# Patient Record
Sex: Female | Born: 1965 | Race: White | Hispanic: No | Marital: Married | Smoking: Current every day smoker
Health system: Southern US, Community
[De-identification: ages and names within clinical notes are randomized; demographics above are authoritative.]

## PROBLEM LIST (undated history)

## (undated) HISTORY — PX: CHOLECYSTECTOMY: SHX55

## (undated) HISTORY — PX: APPENDECTOMY: SHX54

---

## 1998-02-10 ENCOUNTER — Ambulatory Visit (HOSPITAL_COMMUNITY): Admission: RE | Admit: 1998-02-10 | Discharge: 1998-02-10 | Payer: Self-pay | Admitting: Gastroenterology

## 1998-06-12 ENCOUNTER — Encounter: Payer: Self-pay | Admitting: Emergency Medicine

## 1998-06-12 ENCOUNTER — Emergency Department (HOSPITAL_COMMUNITY): Admission: EM | Admit: 1998-06-12 | Discharge: 1998-06-12 | Payer: Self-pay | Admitting: Emergency Medicine

## 1998-06-13 ENCOUNTER — Encounter: Payer: Self-pay | Admitting: Emergency Medicine

## 1998-11-23 ENCOUNTER — Emergency Department (HOSPITAL_COMMUNITY): Admission: EM | Admit: 1998-11-23 | Discharge: 1998-11-23 | Payer: Self-pay

## 1999-02-27 ENCOUNTER — Other Ambulatory Visit: Admission: RE | Admit: 1999-02-27 | Discharge: 1999-02-27 | Payer: Self-pay | Admitting: Obstetrics & Gynecology

## 1999-06-02 ENCOUNTER — Encounter: Payer: Self-pay | Admitting: Obstetrics & Gynecology

## 1999-06-02 ENCOUNTER — Ambulatory Visit (HOSPITAL_COMMUNITY): Admission: RE | Admit: 1999-06-02 | Discharge: 1999-06-02 | Payer: Self-pay | Admitting: Obstetrics & Gynecology

## 1999-08-26 ENCOUNTER — Ambulatory Visit (HOSPITAL_COMMUNITY): Admission: RE | Admit: 1999-08-26 | Discharge: 1999-08-26 | Payer: Self-pay | Admitting: Gastroenterology

## 1999-08-26 ENCOUNTER — Encounter (INDEPENDENT_AMBULATORY_CARE_PROVIDER_SITE_OTHER): Payer: Self-pay | Admitting: Specialist

## 2000-03-06 ENCOUNTER — Emergency Department (HOSPITAL_COMMUNITY): Admission: EM | Admit: 2000-03-06 | Discharge: 2000-03-06 | Payer: Self-pay | Admitting: Emergency Medicine

## 2000-04-04 ENCOUNTER — Other Ambulatory Visit: Admission: RE | Admit: 2000-04-04 | Discharge: 2000-04-04 | Payer: Self-pay | Admitting: Obstetrics & Gynecology

## 2000-07-21 ENCOUNTER — Encounter: Payer: Self-pay | Admitting: Emergency Medicine

## 2000-07-21 ENCOUNTER — Emergency Department (HOSPITAL_COMMUNITY): Admission: EM | Admit: 2000-07-21 | Discharge: 2000-07-21 | Payer: Self-pay | Admitting: Emergency Medicine

## 2000-09-05 ENCOUNTER — Encounter: Admission: RE | Admit: 2000-09-05 | Discharge: 2000-09-05 | Payer: Self-pay | Admitting: Family Medicine

## 2000-09-05 ENCOUNTER — Encounter: Payer: Self-pay | Admitting: Family Medicine

## 2001-04-18 ENCOUNTER — Other Ambulatory Visit: Admission: RE | Admit: 2001-04-18 | Discharge: 2001-04-18 | Payer: Self-pay | Admitting: Obstetrics & Gynecology

## 2001-12-08 ENCOUNTER — Encounter: Admission: RE | Admit: 2001-12-08 | Discharge: 2001-12-08 | Payer: Self-pay | Admitting: Family Medicine

## 2001-12-08 ENCOUNTER — Encounter: Payer: Self-pay | Admitting: Family Medicine

## 2002-03-09 ENCOUNTER — Encounter: Payer: Self-pay | Admitting: Family Medicine

## 2002-03-09 ENCOUNTER — Encounter: Admission: RE | Admit: 2002-03-09 | Discharge: 2002-03-09 | Payer: Self-pay | Admitting: Family Medicine

## 2002-06-18 ENCOUNTER — Encounter: Payer: Self-pay | Admitting: Family Medicine

## 2002-06-18 ENCOUNTER — Encounter: Admission: RE | Admit: 2002-06-18 | Discharge: 2002-06-18 | Payer: Self-pay | Admitting: Family Medicine

## 2002-12-20 ENCOUNTER — Other Ambulatory Visit: Admission: RE | Admit: 2002-12-20 | Discharge: 2002-12-20 | Payer: Self-pay | Admitting: Family Medicine

## 2003-03-18 ENCOUNTER — Encounter: Admission: RE | Admit: 2003-03-18 | Discharge: 2003-03-18 | Payer: Self-pay | Admitting: Orthopedic Surgery

## 2003-05-01 ENCOUNTER — Ambulatory Visit (HOSPITAL_COMMUNITY): Admission: RE | Admit: 2003-05-01 | Discharge: 2003-05-02 | Payer: Self-pay | Admitting: Orthopaedic Surgery

## 2003-05-29 ENCOUNTER — Encounter: Admission: RE | Admit: 2003-05-29 | Discharge: 2003-05-29 | Payer: Self-pay | Admitting: Obstetrics & Gynecology

## 2003-07-19 ENCOUNTER — Other Ambulatory Visit: Admission: RE | Admit: 2003-07-19 | Discharge: 2003-07-19 | Payer: Self-pay | Admitting: Obstetrics & Gynecology

## 2004-03-07 ENCOUNTER — Encounter: Admission: RE | Admit: 2004-03-07 | Discharge: 2004-03-07 | Payer: Self-pay | Admitting: Orthopedic Surgery

## 2004-05-29 ENCOUNTER — Emergency Department (HOSPITAL_COMMUNITY): Admission: EM | Admit: 2004-05-29 | Discharge: 2004-05-30 | Payer: Self-pay | Admitting: Emergency Medicine

## 2004-06-10 ENCOUNTER — Ambulatory Visit (HOSPITAL_COMMUNITY): Admission: RE | Admit: 2004-06-10 | Discharge: 2004-06-10 | Payer: Self-pay | Admitting: Gastroenterology

## 2004-06-10 ENCOUNTER — Encounter: Admission: RE | Admit: 2004-06-10 | Discharge: 2004-06-10 | Payer: Self-pay | Admitting: Gastroenterology

## 2004-06-12 ENCOUNTER — Ambulatory Visit (HOSPITAL_COMMUNITY): Admission: RE | Admit: 2004-06-12 | Discharge: 2004-06-12 | Payer: Self-pay | Admitting: Gastroenterology

## 2004-06-12 ENCOUNTER — Encounter (INDEPENDENT_AMBULATORY_CARE_PROVIDER_SITE_OTHER): Payer: Self-pay | Admitting: *Deleted

## 2004-06-24 ENCOUNTER — Encounter: Admission: RE | Admit: 2004-06-24 | Discharge: 2004-06-24 | Payer: Self-pay | Admitting: Obstetrics & Gynecology

## 2004-08-13 ENCOUNTER — Other Ambulatory Visit: Admission: RE | Admit: 2004-08-13 | Discharge: 2004-08-13 | Payer: Self-pay | Admitting: Obstetrics & Gynecology

## 2005-04-26 ENCOUNTER — Encounter: Admission: RE | Admit: 2005-04-26 | Discharge: 2005-04-26 | Payer: Self-pay | Admitting: Orthopedic Surgery

## 2005-10-14 ENCOUNTER — Encounter: Admission: RE | Admit: 2005-10-14 | Discharge: 2005-10-14 | Payer: Self-pay | Admitting: Obstetrics & Gynecology

## 2006-02-17 ENCOUNTER — Encounter: Admission: RE | Admit: 2006-02-17 | Discharge: 2006-02-17 | Payer: Self-pay | Admitting: Orthopedic Surgery

## 2006-02-19 ENCOUNTER — Encounter: Admission: RE | Admit: 2006-02-19 | Discharge: 2006-02-19 | Payer: Self-pay | Admitting: Orthopaedic Surgery

## 2006-03-30 ENCOUNTER — Encounter: Admission: RE | Admit: 2006-03-30 | Discharge: 2006-03-30 | Payer: Self-pay | Admitting: Orthopaedic Surgery

## 2007-01-02 ENCOUNTER — Emergency Department (HOSPITAL_COMMUNITY): Admission: EM | Admit: 2007-01-02 | Discharge: 2007-01-02 | Payer: Self-pay | Admitting: Emergency Medicine

## 2007-03-08 ENCOUNTER — Ambulatory Visit (HOSPITAL_COMMUNITY): Admission: RE | Admit: 2007-03-08 | Discharge: 2007-03-08 | Payer: Self-pay | Admitting: Obstetrics & Gynecology

## 2007-08-17 ENCOUNTER — Emergency Department (HOSPITAL_COMMUNITY): Admission: EM | Admit: 2007-08-17 | Discharge: 2007-08-17 | Payer: Self-pay | Admitting: Emergency Medicine

## 2008-02-02 ENCOUNTER — Ambulatory Visit (HOSPITAL_COMMUNITY): Admission: RE | Admit: 2008-02-02 | Discharge: 2008-02-02 | Payer: Self-pay | Admitting: Obstetrics & Gynecology

## 2008-02-05 ENCOUNTER — Encounter (INDEPENDENT_AMBULATORY_CARE_PROVIDER_SITE_OTHER): Payer: Self-pay | Admitting: Diagnostic Radiology

## 2008-02-05 ENCOUNTER — Encounter: Admission: RE | Admit: 2008-02-05 | Discharge: 2008-02-05 | Payer: Self-pay | Admitting: Obstetrics & Gynecology

## 2009-01-28 ENCOUNTER — Encounter: Admission: RE | Admit: 2009-01-28 | Discharge: 2009-01-28 | Payer: Self-pay | Admitting: Gastroenterology

## 2009-06-07 ENCOUNTER — Emergency Department (HOSPITAL_COMMUNITY): Admission: EM | Admit: 2009-06-07 | Discharge: 2009-06-07 | Payer: Self-pay | Admitting: Emergency Medicine

## 2009-07-05 ENCOUNTER — Emergency Department (HOSPITAL_COMMUNITY): Admission: EM | Admit: 2009-07-05 | Discharge: 2009-07-05 | Payer: Self-pay | Admitting: Emergency Medicine

## 2009-12-04 ENCOUNTER — Emergency Department (HOSPITAL_COMMUNITY): Admission: EM | Admit: 2009-12-04 | Discharge: 2009-12-04 | Payer: Self-pay | Admitting: Emergency Medicine

## 2010-07-25 LAB — CBC
Platelets: 212 10*3/uL (ref 150–400)
RBC: 3.91 MIL/uL (ref 3.87–5.11)
WBC: 5.4 10*3/uL (ref 4.0–10.5)

## 2010-07-25 LAB — COMPREHENSIVE METABOLIC PANEL
AST: 21 U/L (ref 0–37)
Albumin: 3.7 g/dL (ref 3.5–5.2)
Alkaline Phosphatase: 30 U/L — ABNORMAL LOW (ref 39–117)
Chloride: 107 mEq/L (ref 96–112)
GFR calc Af Amer: >60 mL/min (ref >60)
Potassium: 4.2 mEq/L (ref 3.5–5.1)
Total Bilirubin: 0.7 mg/dL (ref 0.3–1.2)

## 2010-07-29 LAB — URINALYSIS, ROUTINE W REFLEX MICROSCOPIC
Glucose, UA: NEGATIVE mg/dL
Hgb urine dipstick: NEGATIVE
Specific Gravity, Urine: 1.007 (ref 1.005–1.030)
Urobilinogen, UA: 0.2 mg/dL (ref 0.0–1.0)

## 2010-07-29 LAB — CBC
HCT: 40.3 % (ref 36.0–46.0)
MCV: 96.9 fL (ref 78.0–100.0)
RBC: 4.16 MIL/uL (ref 3.87–5.11)
WBC: 7.7 10*3/uL (ref 4.0–10.5)

## 2010-07-29 LAB — POCT I-STAT, CHEM 8
BUN: 18 mg/dL (ref 6–23)
Chloride: 107 mEq/L (ref 96–112)
Creatinine, Ser: 1 mg/dL (ref 0.4–1.2)
Sodium: 138 mEq/L (ref 135–145)
TCO2: 28 mmol/L (ref 0–100)

## 2010-07-29 LAB — DIFFERENTIAL
Eosinophils Absolute: 0.1 10*3/uL (ref 0.0–0.7)
Lymphocytes Relative: 19 % (ref 12–46)
Lymphs Abs: 1.4 10*3/uL (ref 0.7–4.0)
Monocytes Relative: 6 % (ref 3–12)
Neutrophils Relative %: 75 % (ref 43–77)

## 2010-07-29 LAB — POCT CARDIAC MARKERS
CKMB, poc: <1 ng/mL — ABNORMAL LOW (ref 1.0–8.0)
Myoglobin, poc: 35.7 ng/mL (ref 12–200)
Myoglobin, poc: 45.5 ng/mL (ref 12–200)
Troponin i, poc: <0.05 ng/mL (ref 0.00–0.09)

## 2010-09-25 NOTE — Procedures (Signed)
[place]. [facility]  Patient:    Nichole Ellis, Nichole Ellis                   MRN: [id number] Proc. Date: 08/26/99 Adm. Date:  [id number] Disc. Date: [id number] Attending:  Charna Elizabeth Dictator:   Anselmo Rod, M.D. CC:         Freddy Finner, M.D.                           Procedure Report  DATE OF BIRTH:  December 30, 1965  PROCEDURE PERFORMED:  Esophagogastroduodenoscopy with biopsies.  ENDOSCOPIST:  Anselmo Rod, M.D.  INSTRUMENT USED:  Olympus video pan endoscope.  INDICATIONS FOR PROCEDURE:  Severe epigastric pain not responding to PPIs in a 45 year old white female, rule out peptic ulcer disease, esophagitis, gastritis, etc.  PRE-PROCEDURE PREPARATION:  Informed consent was obtained from the patient. The patient was fasted for eight hours prior to the procedure.  PRE-PROCEDURE PHYSICAL EXAMINATION:  VITAL SIGNS:  The patient had stable vital signs.  NECK:  Supple.  CHEST:  Clear to auscultation.  S1 and S2 regular.  ABDOMEN:  Soft, with normal abdominal bowel sounds.  Epigastric tenderness on palpation with guarding, no rebound or rigidity.  DESCRIPTION OF PROCEDURE:  The patient was placed in the left lateral decubitus position and sedated with 40 mg of Demerol and 4 mg of Versed intravenously.  Once the patient was adequately sedated and maintained on low flow oxygen and continuous cardiac monitoring, the Olympus video pan endoscope was advanced through the mouth piece over the tongue into the esophagus under direct vision.  The entire esophagus appeared normal without evidence of ring, stricture, mass, lesions, or esophagitis.  The scope was then advanced into the stomach.  There was a small hiatal hernia seen on high retroflexion. There was active reflux of the gastric debris into the distal esophagus during the procedure.  There was antral gastritis with erythematous mucosa over the antrum.  Biopsies were done from the antrum to rule  out the presence of Helicobacter pylori by CLOtest.  The duodenal bulb and the small bowel distal to above appeared normal.  There was no outlet obstruction.  The patient tolerated the procedure well without complication.  IMPRESSION: 1. Normal appearing esophagus. 2. Severe antral gastritis with patchy hemorrhagic areas. 3. Small hiatal hernia. 4. ______ of small bowel. 5. Active reflux of gastric debris into the distal esophagus.  RECOMMENDATIONS: 1. Continue PPIs. 2. Treat with antibiotics if CLO positive. 3. Avoid all nonsteroidals. 4. Follow antireflux measures strictly. 5. Follow up in the office in the next ten days. DD:  08/28/99 TD:  08/29/99 Job: [id number]

## 2010-09-25 NOTE — H&P (Signed)
NAME:  Nichole Ellis, Nichole Ellis                      ACCOUNT NO.:  [id number]   MEDICAL RECORD NO.:  [id number]                   PATIENT TYPE:  OIB   LOCATION:  5030                                 FACILITY:  [facility]   PHYSICIAN:  Sharolyn Douglas, M.D.                     DATE OF BIRTH:  22-Sep-1965   DATE OF ADMISSION:  05/01/2003  DATE OF DISCHARGE:                                HISTORY & PHYSICAL   CHIEF COMPLAINT:  Neck and right upper extremity pain.   HISTORY OF PRESENT ILLNESS:  The patient is a 45 year old female who has  been having progressing neck and right upper extremity pain since March.  She denies any traumatic event at that time.  Has gotten a little bit better  shortly after beginning; however, since that time shortly after the injury  has been progressively getting worse.  She describes this as severe pain in  her neck, a burning, aching sensation radiating into her right trapezius and  going down her right upper extremity.  She has tried numerous subsequent  conservative management which, unfortunately, have not given her any  reliable relief of her symptoms.  Secondary to these findings as well as her  MRI findings of HNP at C5-6 and ____________ ACDF at this level.  Risks and  benefits of procedure were discussed with patient by Dr. Noel Gerold as well as  myself.  She indicated understanding and opted to proceed.   ALLERGIES:  No known drug allergies.   MEDICATIONS:  1. Mobic daily.  2. Robaxin p.r.n.   PAST MEDICAL HISTORY:  Healthy.   PAST SURGICAL HISTORY:  1. Partial hysterectomy in 1991.  2. Cholecystectomy in 1996.  3. Appendectomy at age 3.   SOCIAL HISTORY:  The patient smokes approximately half to one pack of  cigarettes per day.  Drinks alcohol on an occasional basis.  She is  divorced, has three children, one 2 year old living with her.   FAMILY HISTORY:  Father alive at age 38 with pleurisy, hypertension.  Mother  alive at 55 and healthy.  Sister with  history of breast cancer.   REVIEW OF SYSTEMS:  The patient denies fevers, chills, sweats, bleeding  tendencies.  CNS:  Denies blurred vision, double vision, seizure, headache,  paralysis.  CARDIOVASCULAR:  Denies chest pain, angina, orthopnea,  claudication, palpitations.  PULMONARY:  Denies shortness of breath,  productive cough, or hemoptysis.  GASTROINTESTINAL:  Denies nausea,  vomiting, constipation, diarrhea, melena, or bloody stool.  GENITOURINARY:  Denies dysuria, hematuria, discharge.  MUSCULOSKELETAL:  As per HPI.   PHYSICAL EXAMINATION:  GENERAL:  The patient is a 45 year old white female  who is alert and oriented, in no acute distress.  She is well nourished,  well groomed, appears her stated age, pleasant, cooperative to examination.  HEENT:  Head is normocephalic, atraumatic.  Pupils are equal, round, and  reactive to light.  Extraocular movements are intact.  Nares patent.  Pharynx clear.  NECK:  Soft to palpation.  No lymphadenopathy, thyromegaly, or bruits  appreciated.  CHEST:  Clear to auscultation bilaterally.  No rales, rhonchi, strider,  wheezes, friction rubs.  BREAST:  Not pertinent.  Not performed.  HEART:  S1, S2.  Regular rate and rhythm.  No murmurs, rubs, or gallops.  ABDOMEN:  Soft to palpation.  Positive bowel sounds.  Nontender,  nondistended.  No organomegaly noted.  GENITOURINARY:  Not pertinent.  Not performed.  EXTREMITIES:  The patient has right upper extremity pain.  Pulses are intact  and symmetric.  Motor function and sensation are grossly intact.  SKIN:  Intact without lesions or rashes.   LABORATORIES:  MRI shows HNP at C5-6 on the right.   IMPRESSION:  Herniated nucleus pulposus C5-6.   PLAN:  1. Admit to [facility] on May 01, 2003, for an ACDF of C5-6.     This will be done by Sharolyn Douglas, M.D.  2. The patient's primary care physician is Dr. Jennette Kettle in [place].      Verlin Fester, P.A.                       Sharolyn Douglas,  M.D.    CM/MEDQ  D:  05/01/2003  T:  05/02/2003  Job:  [id number]

## 2010-09-25 NOTE — Op Note (Signed)
NAMEHANNAN, Nichole Ellis            ACCOUNT NO.:  [id number]   MEDICAL RECORD NO.:  [id number]          PATIENT TYPE:  AMB   LOCATION:  ENDO                         FACILITY:  [facility]   PHYSICIAN:  Anselmo Rod, M.D.  DATE OF BIRTH:  01/12/1966   DATE OF PROCEDURE:  06/12/2004  DATE OF DISCHARGE:                                 OPERATIVE REPORT   PROCEDURE PERFORMED:  Esophagogastroduodenoscopy with gastric biopsies.   ENDOSCOPIST:  Anselmo Rod, M.D.   INSTRUMENT USED:  Olympus video panendoscope.   INDICATIONS FOR PROCEDURE:  A 45 year old white female with history of  severe epigastric and right upper quadrant pain and some chest pain on the  right side with burning retrosternal discomfort, undergoing an EGD to rule  out peptic ulcer disease, esophagitis, gastritis, etc.   PREPROCEDURE PREPARATION:  Informed consent was procured from the patient.  The patient fasted for eight hours prior to the procedure and prepped with a  bottle of magnesium citrate and a gallon of GoLYTELY the night prior to the  procedure.   PREPROCEDURE PHYSICAL EXAMINATION:  VITAL SIGNS:  Stable.  NECK:  Supple.  CHEST:  Clear to auscultation.  CARDIOVASCULAR:  S1 and S2 regular.  ABDOMEN:  Soft with normal bowel sounds.   DESCRIPTION OF PROCEDURE:  The patient was placed in left lateral decubitus  position, sedated with 60 mg of Demerol and 8  mg of Versed in slow  incremental doses.  Once the patient was adequately sedated and maintained  on low flow oxygen and continuous cardiac monitoring, the Olympus video  panendoscope was advanced through the mouthpiece over the tongue and into  the esophagus under direct vision.  The entire esophagus appeared normal  with no evidence of  ring, structure, masses, esophagus or Barrett's mucosa.  The scope was then advanced in the stomach.  A small hiatal hernia was seen  on high retroflexion.  The gastric mucosa appeared healthy.  No ulcers,  erosions,  masses or polyps were seen. Gastric biopsies were done to rule out  the present of H. pylori by pathology.  The proximal small-bowel appeared  normal.  There was no outlet obstruction. The patient tolerated the  procedure well without complications.   IMPRESSION:  1.  Normal-appearing  esophagus, no esophagitis, Barrett's or stricture      seen.  2.  A small hiatal hernia, otherwise normal-appearing stomach, gastric      biopsies done for Helicobacter pylori.  3.  Normal proximal small-bowel.   RECOMMENDATIONS:  1.  Continue PPIs.  2.  Add Carafate slurry 1 g q.i.d. for the next two weeks.  3.  Avoid all nonsteroidals.  4.  Stop smoking.  5.  Outpatient follow-up within the next two weeks for further      recommendations.      JNM/MEDQ  D:  06/12/2004  T:  06/12/2004  Job:  [id number]   cc:   Quita Skye. Artis Flock, M.D.  [address], [address]  [place]  [place] [address]  Fax: [phone]

## 2010-09-25 NOTE — Op Note (Signed)
NAME:  Nichole Ellis, Nichole Ellis                      ACCOUNT NO.:  [id number]   MEDICAL RECORD NO.:  [id number]                   PATIENT TYPE:  OIB   LOCATION:  5030                                 FACILITY:  [facility]   PHYSICIAN:  Sharolyn Douglas, M.D.                     DATE OF BIRTH:  [birth year]   DATE OF PROCEDURE:  05/01/2003  DATE OF DISCHARGE:                                 OPERATIVE REPORT   PREOPERATIVE DIAGNOSIS:  Cervical radiculopathy and herniated nucleus  pulposus, C5-6.   POSTOPERATIVE DIAGNOSIS:  Cervical radiculopathy and herniated nucleus  pulposus, C5-6.   OPERATION PERFORMED:  1. Anterior cervical diskectomy,  C5-6.  2. Anterior cervical arthrodesis, C5-6 with placement of Nuvasive allograft     prosthesis spacer packed with local autogenous bone graft with anterior     cervical plating C5-6 utilizing the Trinica system.   SURGEON:  Sharolyn Douglas, M.D.   ASSISTANT:  Verlin Fester, P.A.   ANESTHESIA:  General endotracheal.   COMPLICATIONS:  None.   INDICATIONS FOR PROCEDURE:  The patient is a 45 year old right-handed  dominant female with a four-month history of intractable right upper  extremity pain.  Her MRI scan demonstrated a moderately large disk  herniation at C5-6 which was felt to be symptomatic.  She had failed all  conservative treatment modalities and elected to undergo ACDF at C5-6 in  hopes of improving her symptoms.  Risks, benefits and alternatives discussed  and the patient elected to proceed.   DESCRIPTION OF PROCEDURE:  The patient was properly identified in the  holding area and taken to the operating room.  She was given general  endotracheal anesthesia without difficulty.  She was given prophylactic  intravenous antibiotics.  She was carefully positioned on the operating  table with the neck in slight extension using the Mayfield head rest.  All  bony prominences were padded.  Face and eyes protected at all times.  The  neck was prepped and draped  in the usual sterile fashion.  A 3 cm incision  was made on the left side of the neck just above the cricoid cartilage in a  natural skin crease.  Dissection was carried sharply through the platysma.  The interval between sternocleidomastoid and strap muscles medially was  developed down to the prevertebral space.  The esophagus, trachea and  carotid sheath were identified and protected at all times.  The longus colli  muscle was elevated out over the C5-6 disk space after the spinal needle and  x-ray confirmed its location.  A deep Shadow Line retractor was placed.  Sharp annulotomy was performed.  Diskectomy was carried back to the  posterior longitudinal ligament.  The disk material was found to be  moderately degenerative.  A high speed bur was used to remove the  cartilaginous end plates and take down the uncovertebral joint.  The  Kerrison punch was used to undercut the  posterior vertebral margins.  The  posterior longitudinal ligament was examined and a rent was identified on  the right side with disk material protruding through.  The rent was entered  using the Kerrison and the posterior longitudinal ligament was taken down.  We then removed a moderately large disk fragment from the spinal canal.  A  nerve hook was utilized to remove several additional foraminal and  posterolateral disk fragments from the spinal canal.  The wound was  irrigated.  We completed foraminotomies bilaterally.  The foramina were  patent using a blunt probe.  Bleeding controlled with bipolar electrocautery  and Gelfoam.  Caspar distraction pins were placed.  Gentle distraction  applied across the C5-6 level.  A 7 mm Nuvasive allograft prosthesis.  Nuvasive was packed with local autogenous bone graft collected from the bur  shavings.  The prosthesis graft was then carefully tamped into position and  countersunk 1 mm.  We confirmed there was room posterior.  We placed a  Trinica plate using standard  technique.  Four screws 12 mm in length were  utilized.  Her bone quality was soft.  The screw purchase was marginal.  We  ensured that the locking mechanism engaged.  The wound was irrigated.  Deep  Penrose drain left in place.  The platysma muscle closed with an interrupted  2-0 Vicryl, subcutaneous layer closed with interrupted 3-0 Vicryl followed  by a running 4-0 subcuticular suture on the skin.  Benzoin and Steri-Strips  placed.  Sterile dressing applied.  The patient was placed into a soft  cervical collar, extubated without difficulty and transferred to recovery  room in stable condition able to move her upper and lower extremities.   The first disk space identified over the  The wound was copiously irrigated.  Intraoperative x-ray showed good positioning of the plate and interbody  graft.  Deep Penrose drain was placed.  Platysma was closed with an  interrupted 2-0 Vicryl.  Subcutaneous layer closed with interrupted 3-0  Vicryl followed by a running 4-0 subcuticular suture on the skin.  Benzoin  and Steri-Strips were placed.  Sterile dressing applied.  The patient was  placed into a soft collar, extubated without difficulty and transferred to  the recovery room in stable condition.  It should be noted that there were  no deleterious changes in the SSEP's or EMG's throughout the procedure.                                               Sharolyn Douglas, M.D.    MC/MEDQ  D:  05/01/2003  T:  05/02/2003  Job:  [id number]

## 2011-02-02 LAB — CBC
HCT: 38.6
MCHC: 34.3
MCV: 94.3
Platelets: 223
RDW: 13.3

## 2011-02-02 LAB — POCT I-STAT, CHEM 8
Calcium, Ion: 1.15
Hemoglobin: 13.3
Sodium: 142
TCO2: 28

## 2011-02-02 LAB — DIFFERENTIAL
Basophils Relative: 1
Eosinophils Absolute: 0.2
Eosinophils Relative: 3
Neutrophils Relative %: 40 — ABNORMAL LOW

## 2011-02-02 LAB — D-DIMER, QUANTITATIVE: D-Dimer, Quant: 0.27

## 2011-02-02 LAB — POCT CARDIAC MARKERS
CKMB, poc: <1 — ABNORMAL LOW
Myoglobin, poc: 34.7
Myoglobin, poc: 35.3
Operator id: 146091
Operator id: 285841
Troponin i, poc: <0.05

## 2011-02-19 LAB — URINALYSIS, ROUTINE W REFLEX MICROSCOPIC
Bilirubin Urine: NEGATIVE
Glucose, UA: NEGATIVE
Hgb urine dipstick: NEGATIVE
Ketones, ur: NEGATIVE
Nitrite: NEGATIVE
Protein, ur: NEGATIVE
Specific Gravity, Urine: 1.014
Urobilinogen, UA: 0.2
pH: 5.5

## 2011-02-19 LAB — POCT PREGNANCY, URINE
Operator id: 146091
Preg Test, Ur: NEGATIVE

## 2011-02-19 LAB — I-STAT 8, (EC8 V) (CONVERTED LAB)
Acid-base deficit: 1
BUN: 10
Chloride: 106
HCT: 43
Operator id: 151321
Potassium: 3.5
pCO2, Ven: 46.5
pH, Ven: 7.337 — ABNORMAL HIGH

## 2011-02-19 LAB — URINE MICROSCOPIC-ADD ON

## 2011-02-19 LAB — POCT I-STAT CREATININE: Creatinine, Ser: 0.9

## 2011-07-26 IMAGING — CT CT ABDOMEN W/ CM
3 of 5 series · 13 of 36 positions shown, 19 images · IV contrast (READICAT/WATER & [ID] OMNI 300)
Comparison: Noncontrast abdominal pelvic CT 01/02/2007.

CT ABDOMEN

CLINICAL DATA: Right upper quadrant abdominal pain with nausea for
several months.  History of benign colonic polypectomy,
appendectomy and partial hysterectomy.

CT ABDOMEN AND PELVIS WITH CONTRAST
TECHNIQUE: Multidetector CT imaging of the abdomen and pelvis was
performed using the standard protocol following bolus
administration of intravenous contrast.
Contrast: 100 ml Mmnipaque-LII intravenously.  Oral contrast was
given.

[Series 3: routine abdomen · axial · 0.66mm/px · z∈[-362,-78]mm · 7 of 76 slices shown, 12 images]
[im 10/76  soft-tissue]
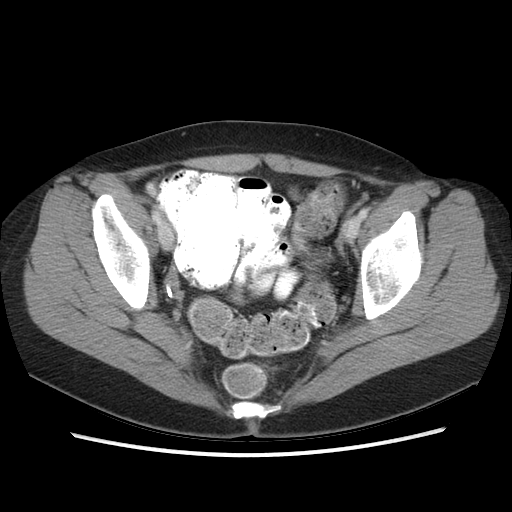
[im 10/76  bone]
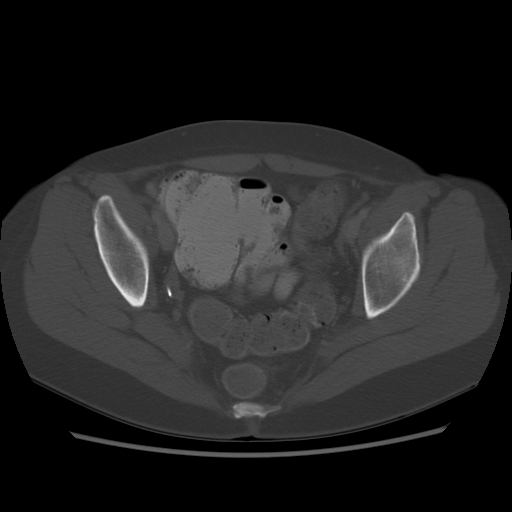
[im 19/76  soft-tissue]
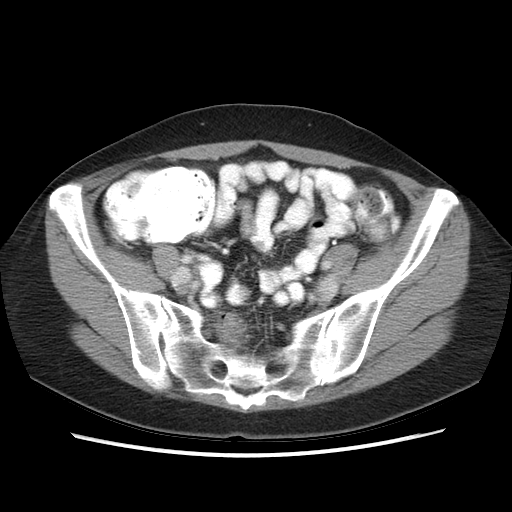
[im 29/76  soft-tissue]
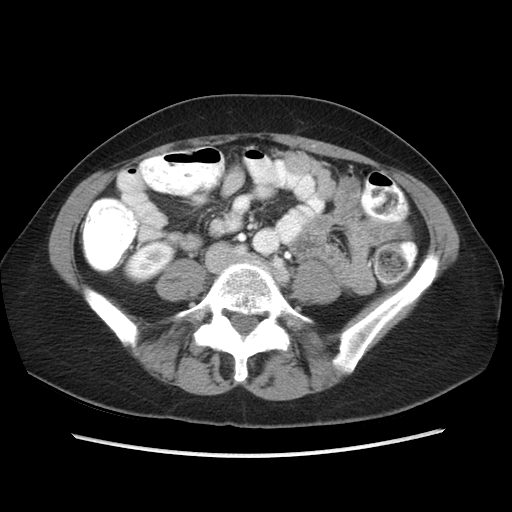
[im 38/76  soft-tissue]
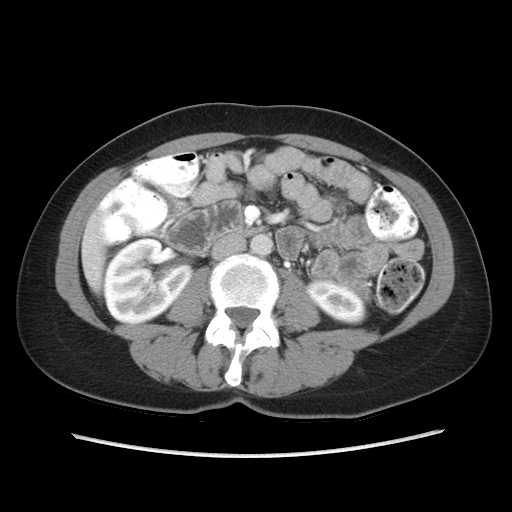
[im 38/76  lung]
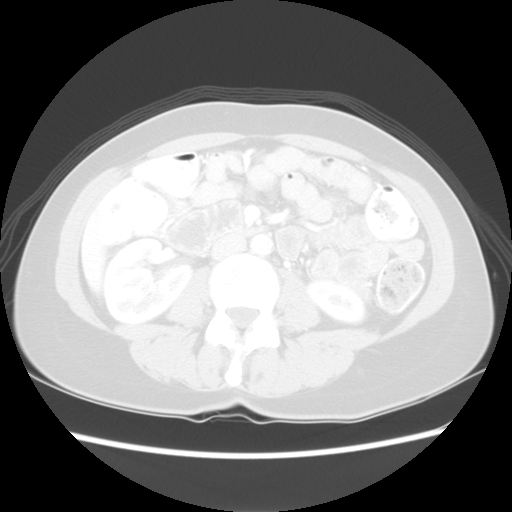
[im 47/76  soft-tissue]
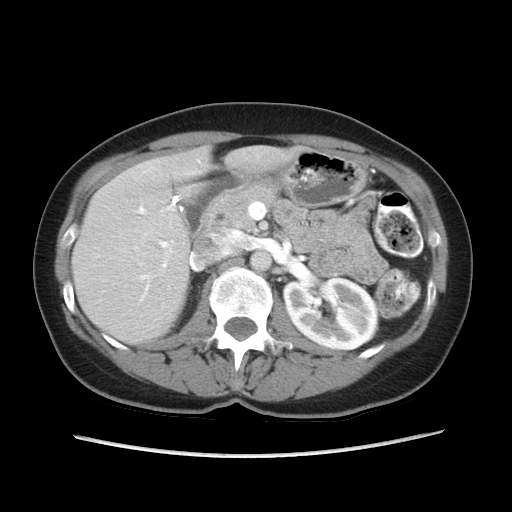
[im 47/76  lung]
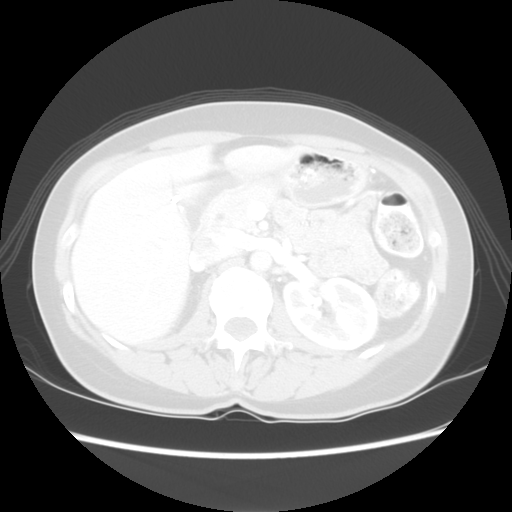
[im 57/76  soft-tissue]
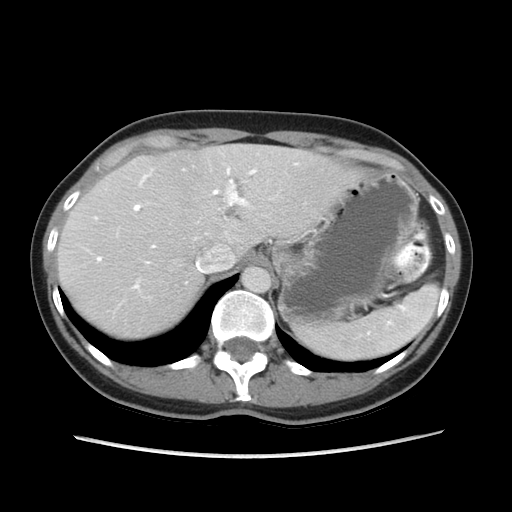
[im 57/76  lung]
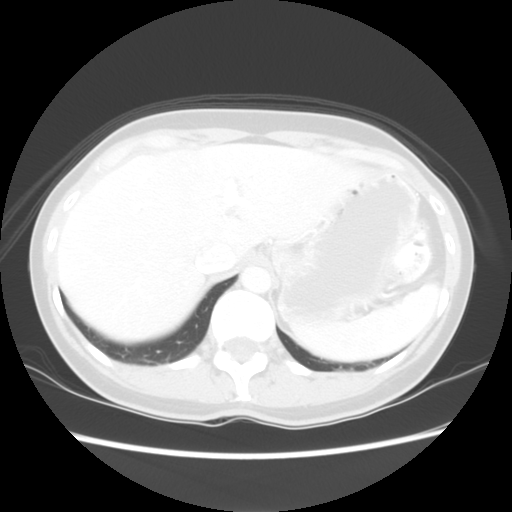
[im 66/76  soft-tissue]
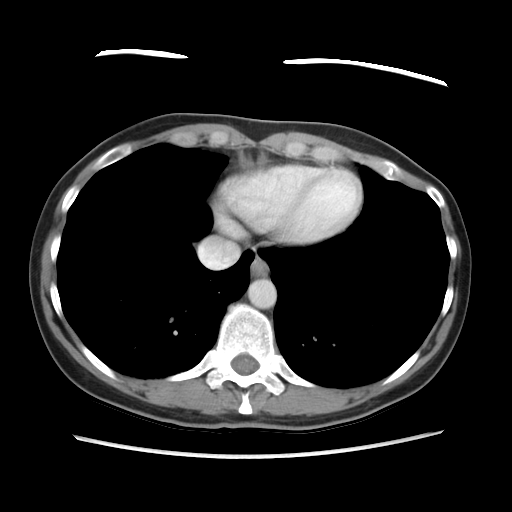
[im 66/76  lung]
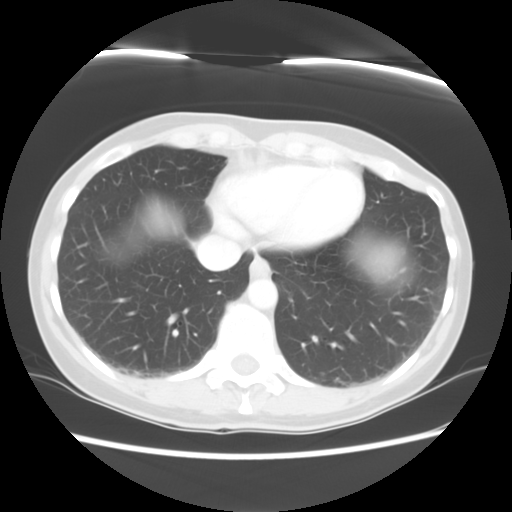

[Series 601: coronal body · coronal · 0.89mm/px · 1 of 102 slices shown, 2 images]
[im 34/102  soft-tissue]
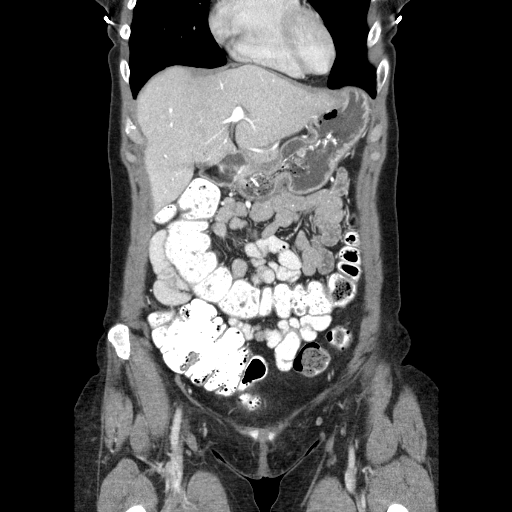
[im 34/102  bone]
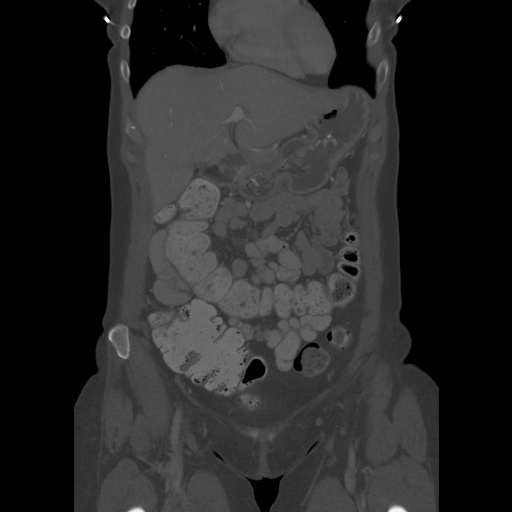

[Series 602: sagittal body · sagittal · 0.89mm/px · 5 of 137 slices shown]
[im 9/137  soft-tissue]
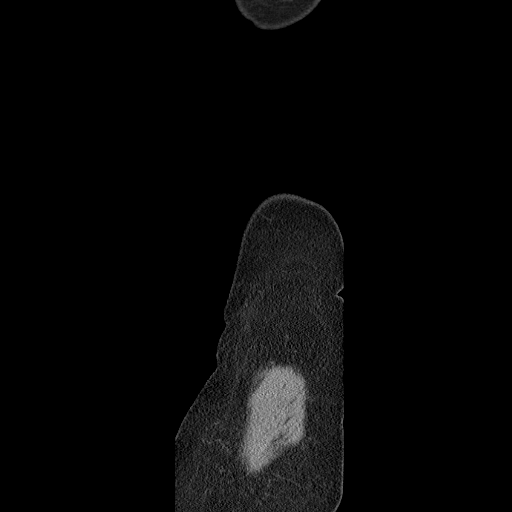
[im 26/137  soft-tissue]
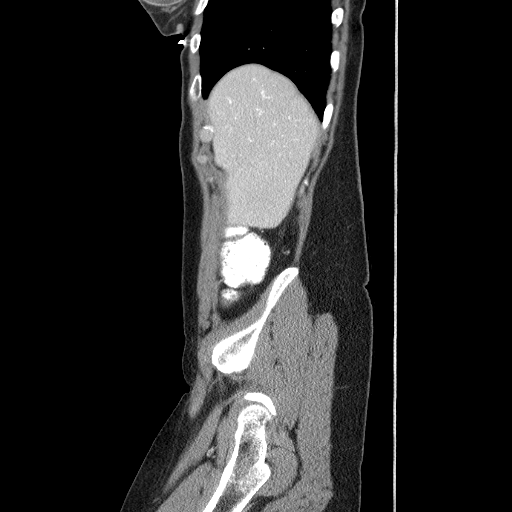
[im 43/137  soft-tissue]
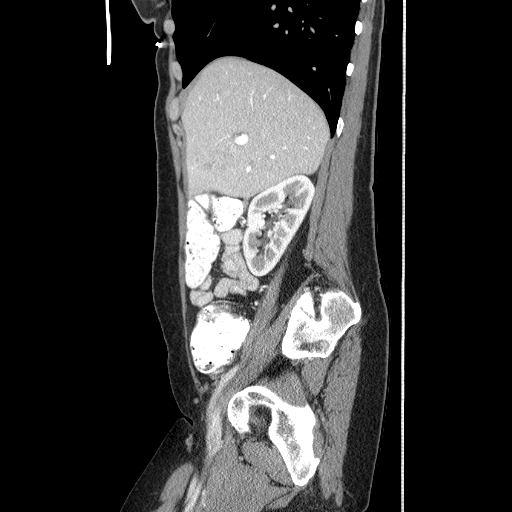
[im 60/137  soft-tissue]
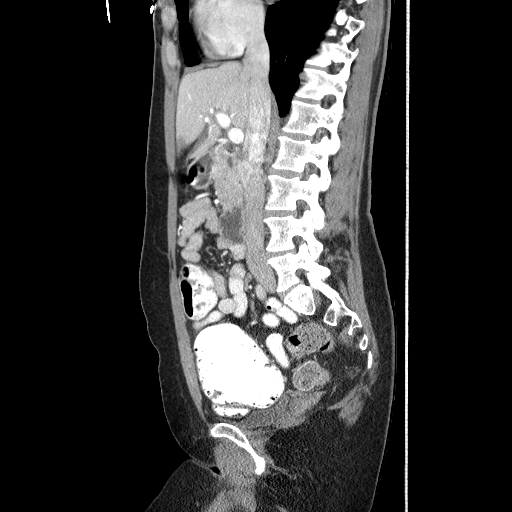
[im 77/137  soft-tissue]
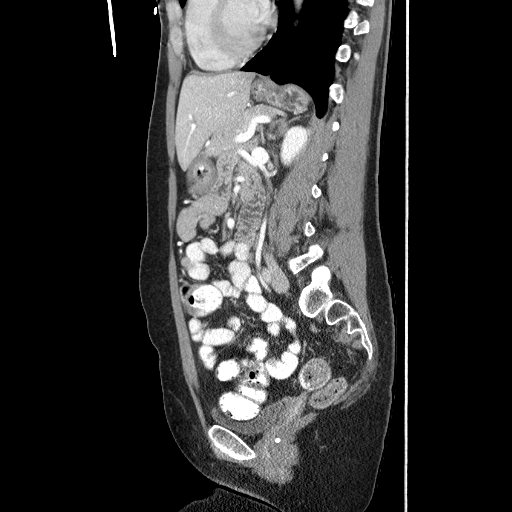

[13 of 36 positions shown; findings below may reference images not displayed]

FINDINGS: Small noncalcified nodules in the left lower lobe on
images 4 and 7 are unchanged.  There is a stable calcified
granuloma on image 4.  No suspicious nodules are present.  There is
no pleural effusion.

There is no biliary dilatation status post cholecystectomy.  The
liver appears normal.  The spleen, pancreas, adrenal glands and
kidneys appear normal.

There is no bowel wall thickening, inflammatory change or
lymphadenopathy.  No vascular abnormalities are identified.
IMPRESSION: 1.  Stable CT of the abdomen.  No acute abdominal findings are
demonstrated status post cholecystectomy.
2.  Stable nodularity at the left lung base compatible with post
inflammatory scarring.

CT PELVIS
FINDINGS: There is moderate stool in the distal colon.  No bowel
wall thickening, inflammatory change or adenopathy is identified.
Both ovaries appear normal status post hysterectomy.  There are
stable pelvic phleboliths bilaterally.
IMPRESSION: Stable examination status post hysterectomy.  No acute pelvic
findings.  Possible constipation.

## 2013-11-15 ENCOUNTER — Emergency Department (HOSPITAL_COMMUNITY)
Admission: EM | Admit: 2013-11-15 | Discharge: 2013-11-15 | Disposition: A | Discharge status: Home / Self Care | Payer: 59

## 2013-11-15 NOTE — ED Notes (Signed)
Pt states her pain has resolved and she wants to go home  Encouraged pt to stay but she declined

## 2014-04-17 ENCOUNTER — Emergency Department (HOSPITAL_COMMUNITY)
Admission: EM | Admit: 2014-04-17 | Discharge: 2014-04-17 | Disposition: A | Discharge status: Home / Self Care | Payer: 59 | Attending: Emergency Medicine | Admitting: Emergency Medicine

## 2014-04-17 ENCOUNTER — Emergency Department (HOSPITAL_COMMUNITY): Payer: 59

## 2014-04-17 ENCOUNTER — Encounter (HOSPITAL_COMMUNITY): Payer: Self-pay

## 2014-04-17 DIAGNOSIS — R0602 Shortness of breath: Secondary | ICD-10-CM | POA: Insufficient documentation

## 2014-04-17 DIAGNOSIS — R1013 Epigastric pain: Secondary | ICD-10-CM | POA: Insufficient documentation

## 2014-04-17 DIAGNOSIS — R7989 Other specified abnormal findings of blood chemistry: Secondary | ICD-10-CM | POA: Diagnosis not present

## 2014-04-17 DIAGNOSIS — Z720 Tobacco use: Secondary | ICD-10-CM | POA: Insufficient documentation

## 2014-04-17 DIAGNOSIS — R109 Unspecified abdominal pain: Secondary | ICD-10-CM

## 2014-04-17 DIAGNOSIS — R945 Abnormal results of liver function studies: Secondary | ICD-10-CM

## 2014-04-17 LAB — URINALYSIS, ROUTINE W REFLEX MICROSCOPIC
BILIRUBIN URINE: NEGATIVE
GLUCOSE, UA: NEGATIVE mg/dL
Hgb urine dipstick: NEGATIVE
KETONES UR: 15 mg/dL — AB
NITRITE: NEGATIVE
PROTEIN: NEGATIVE mg/dL
Specific Gravity, Urine: 1.026 (ref 1.005–1.030)
Urobilinogen, UA: 4 mg/dL — ABNORMAL HIGH (ref 0.0–1.0)
pH: 6.5 (ref 5.0–8.0)

## 2014-04-17 LAB — URINE MICROSCOPIC-ADD ON

## 2014-04-17 LAB — COMPREHENSIVE METABOLIC PANEL
ALT: 38 U/L — ABNORMAL HIGH (ref 0–35)
AST: 66 U/L — ABNORMAL HIGH (ref 0–37)
Albumin: 3.2 g/dL — ABNORMAL LOW (ref 3.5–5.2)
Alkaline Phosphatase: 31 U/L — ABNORMAL LOW (ref 39–117)
Anion gap: 12 (ref 5–15)
BUN: 19 mg/dL (ref 6–23)
CALCIUM: 8.9 mg/dL (ref 8.4–10.5)
CO2: 23 meq/L (ref 19–32)
CREATININE: 0.82 mg/dL (ref 0.50–1.10)
Chloride: 105 mEq/L (ref 96–112)
GFR, EST NON AFRICAN AMERICAN: 83 mL/min — AB (ref >90)
GLUCOSE: 103 mg/dL — AB (ref 70–99)
Potassium: 3.4 mEq/L — ABNORMAL LOW (ref 3.7–5.3)
Sodium: 140 mEq/L (ref 137–147)
Total Bilirubin: 0.5 mg/dL (ref 0.3–1.2)
Total Protein: 6.3 g/dL (ref 6.0–8.3)

## 2014-04-17 LAB — CBC WITH DIFFERENTIAL/PLATELET
Basophils Absolute: 0 10*3/uL (ref 0.0–0.1)
Basophils Relative: 0 % (ref 0–1)
EOS PCT: 1 % (ref 0–5)
Eosinophils Absolute: 0.2 10*3/uL (ref 0.0–0.7)
HEMATOCRIT: 34.6 % — AB (ref 36.0–46.0)
Hemoglobin: 11.8 g/dL — ABNORMAL LOW (ref 12.0–15.0)
LYMPHS ABS: 1 10*3/uL (ref 0.7–4.0)
LYMPHS PCT: 8 % — AB (ref 12–46)
MCH: 31.3 pg (ref 26.0–34.0)
MCHC: 34.1 g/dL (ref 30.0–36.0)
MCV: 91.8 fL (ref 78.0–100.0)
MONO ABS: 0.5 10*3/uL (ref 0.1–1.0)
MONOS PCT: 4 % (ref 3–12)
Neutro Abs: 10.1 10*3/uL — ABNORMAL HIGH (ref 1.7–7.7)
Neutrophils Relative %: 87 % — ABNORMAL HIGH (ref 43–77)
Platelets: 227 10*3/uL (ref 150–400)
RBC: 3.77 MIL/uL — AB (ref 3.87–5.11)
RDW: 13 % (ref 11.5–15.5)
WBC: 11.7 10*3/uL — AB (ref 4.0–10.5)

## 2014-04-17 LAB — LIPASE, BLOOD: LIPASE: 42 U/L (ref 11–59)

## 2014-04-17 LAB — TROPONIN I: Troponin I: <0.3 ng/mL (ref <0.30)

## 2014-04-17 LAB — I-STAT TROPONIN, ED: Troponin i, poc: 0 ng/mL (ref 0.00–0.08)

## 2014-04-17 LAB — I-STAT CG4 LACTIC ACID, ED: Lactic Acid, Venous: 0.9 mmol/L (ref 0.5–2.2)

## 2014-04-17 MED ORDER — HYDROCODONE-ACETAMINOPHEN 5-325 MG PO TABS: 1.0000 | ORAL_TABLET | ORAL | Status: AC | PRN

## 2014-04-17 MED ORDER — DICYCLOMINE HCL 20 MG PO TABS: 20.0000 mg | ORAL_TABLET | Freq: Two times a day (BID) | ORAL | Status: AC

## 2014-04-17 MED ORDER — DIPHENHYDRAMINE HCL 50 MG/ML IJ SOLN
25.0000 mg | Freq: Once | INTRAMUSCULAR | Status: AC
  Administered 2014-04-17: 25 mg via INTRAVENOUS
  Filled 2014-04-17: qty 1

## 2014-04-17 MED ORDER — METOCLOPRAMIDE HCL 5 MG/ML IJ SOLN
5.0000 mg | Freq: Once | INTRAMUSCULAR | Status: AC
  Administered 2014-04-17: 5 mg via INTRAVENOUS
  Filled 2014-04-17: qty 2

## 2014-04-17 MED ORDER — IOHEXOL 300 MG/ML  SOLN
80.0000 mL | Freq: Once | INTRAMUSCULAR | Status: AC | PRN
  Administered 2014-04-17: 80 mL via INTRAVENOUS

## 2014-04-17 MED ORDER — IOHEXOL 300 MG/ML  SOLN: 20.0000 mL | INTRAMUSCULAR | Status: AC

## 2014-04-17 MED ORDER — ESOMEPRAZOLE MAGNESIUM 40 MG PO CPDR: 40.0000 mg | DELAYED_RELEASE_CAPSULE | Freq: Every day | ORAL | Status: AC

## 2014-04-17 MED ORDER — KETOROLAC TROMETHAMINE 30 MG/ML IJ SOLN
30.0000 mg | Freq: Once | INTRAMUSCULAR | Status: AC
  Administered 2014-04-17: 30 mg via INTRAVENOUS
  Filled 2014-04-17: qty 1

## 2014-04-17 NOTE — ED Notes (Signed)
Patient transported to CT 

## 2014-04-17 NOTE — ED Provider Notes (Signed)
CSN: [id number]     Arrival date & time 04/17/14  [id number] History   First MD Initiated Contact with Patient 04/17/14 [phone]     Chief Complaint  Patient presents with  . Abdominal Pain     (Consider location/radiation/quality/duration/timing/severity/associated sxs/prior Treatment) Patient is a 48 y.o. female presenting with abdominal pain. The history is provided by the patient. No language interpreter was used.  Abdominal Pain Pain location:  Epigastric Pain quality: sharp and squeezing   Pain radiates to:  Does not radiate Pain severity:  Severe Onset quality:  Sudden Duration:  2 hours Timing:  Constant Progression:  Waxing and waning Chronicity:  New Context: awakening from sleep, previous surgery (APPENDECTOMY/ CHOLECYSTECTOMY) and recent travel   Context: not alcohol use, not diet changes, not eating, not laxative use, not medication withdrawal, not retching, not sick contacts and not suspicious food intake   Relieved by:  None tried Worsened by:  Nothing tried Ineffective treatments:  None tried Associated symptoms: constipation and shortness of breath   Associated symptoms: no chest pain, no chills, no diarrhea, no dysuria, no fever, no hematemesis, no hematochezia, no hematuria, no melena, no nausea and no vomiting   Associated symptoms comment:  Diaphoresis Shortness of breath:    Severity:  Moderate   Timing:  Intermittent (associated with epigastric pain) Risk factors: no alcohol abuse, no aspirin use, not elderly, has not had multiple surgeries, no NSAID use, not obese, not pregnant and no recent hospitalization     No past medical history on file. Past Surgical History  Procedure Laterality Date  . Appendectomy    . Cholecystectomy     No family history on file. History  Substance Use Topics  . Smoking status: Current Every Day Smoker  . Smokeless tobacco: Not on file  . Alcohol Use: No   OB History    No data available     Review of Systems    Constitutional: Negative for fever and chills.  HENT: Positive for congestion.   Respiratory: Positive for shortness of breath.   Cardiovascular: Negative for chest pain.  Gastrointestinal: Positive for abdominal pain and constipation. Negative for nausea, vomiting, diarrhea, melena, hematochezia and hematemesis.  Genitourinary: Negative for dysuria and hematuria.  Neurological: Negative.   All other systems reviewed and are negative.     Allergies  Review of patient's allergies indicates no known allergies.  Home Medications   Prior to Admission medications   Not on File   Temp(Src) 97.8 F (36.6 C) (Oral)  Ht 5\' 8"  (1.727 m)  Wt 138 lb (62.596 kg)  BMI 20.99 kg/m2  LMP  Physical Exam  Constitutional: She is oriented to person, place, and time. She appears well-developed and well-nourished. No distress.  HENT:  Head: Normocephalic and atraumatic.  Eyes: Conjunctivae are normal. No scleral icterus.  Neck: Normal range of motion.  Cardiovascular: Normal rate, regular rhythm and normal heart sounds.  Exam reveals no gallop and no friction rub.   No murmur heard. Pulmonary/Chest: Effort normal and breath sounds normal. No respiratory distress.  Abdominal: Soft. Bowel sounds are normal. She exhibits no distension and no mass. There is no tenderness. There is no rebound and no guarding.    Neurological: She is alert and oriented to person, place, and time.  Skin: Skin is warm and dry. She is not diaphoretic.  Nursing note and vitals reviewed.   ED Course  Procedures (including critical care time) Labs Review Labs Reviewed  CBC WITH DIFFERENTIAL  COMPREHENSIVE METABOLIC  PANEL  LIPASE, BLOOD  URINALYSIS, ROUTINE W REFLEX MICROSCOPIC  TROPONIN I    Imaging Review No results found.   EKG Interpretation   Date/Time:  Wednesday April 17 2014 05:31:48 EST Ventricular Rate:  62 PR Interval:  145 QRS Duration: 87 QT Interval:  426 QTC Calculation: 433 R Axis:    66 Text Interpretation:  Sinus rhythm Normal ECG When compared with ECG of  12/04/2009, No significant change was found Confirmed by [facility] CenterGLICK  MD, DAVID  ([id number]) on 04/17/2014 5:37:59 AM      MDM   Final diagnoses:  Abdominal pain    6:32 AM Temp(Src) 97.8 F (36.6 C) (Oral)  Ht 5\' 8"  (1.727 m)  Wt 138 lb (62.596 kg)  BMI 20.99 kg/m2  LMP  Patient with acute onset epigastric pain. She had associated sob, and diaphoresis. Severe, constant with pain crescendos. Awaiting labs. Will obtain ekg cxr, abdominal films normal EKG.   7:38 AM BP 100/62 mmHg  Pulse 70  Temp(Src) 97.8 F (36.6 C) (Oral)  Resp 15  Ht 5\' 8"  (1.727 m)  Wt 138 lb (62.596 kg)  BMI 20.99 kg/m2  SpO2 99%  LMP  Patien with apparent hypotension, resolving with second bolus.  Abnormal labs include a leukocytosis with left shift, mild hypokalemia, slightly elevated blood glucose. Troponin is negative. Blood chemistry panel also shows an elevated AST and ALT. I discussed the case with Dr. Silverio LayYao who has requested additional tests of a lactate and CT scan of the abdomen. Patient's x-ray reveals a questionable ileus, large stool volume.   9:52 AM BP 98/56 mmHg  Pulse 67  Temp(Src) 97.8 F (36.6 C) (Oral)  Resp 15  Ht 5\' 8"  (1.727 m)  Wt 138 lb (62.596 kg)  BMI 20.99 kg/m2  SpO2 99%  LMP  Patient with baseline bp per herself and husband.  No acute abnormality on ct. D/c with nexium, norco, and bentyl. F/u with pcp regarding LFTs. Discussed return precautions.  Patient is nontoxic, nonseptic appearing, in no apparent distress.  Patient's pain and other symptoms adequately managed in emergency department.  Fluid bolus given.  Labs, imaging and vitals reviewed.  Patient does not meet the SIRS or Sepsis criteria.  On repeat exam patient does not have a surgical abdomin and there are no peritoneal signs.  No indication of appendicitis, bowel obstruction, bowel perforation, cholecystitis, diverticulitis.  Patient  discharged home with symptomatic treatment and given strict instructions for follow-up with their primary care physician.  I have also discussed reasons to return immediately to the ER.  Patient expresses understanding and agrees with plan.     Arthor Captainbigail Tomma Ehinger, PA-C 04/17/14 [id number]  Dione Boozeavid Glick, MD 04/18/14 [phone]

## 2014-04-17 NOTE — Discharge Instructions (Signed)
Your labs showed an elevation in your liver function tests. They're not markedly elevated. This could be secondary to a viral infection. Your abdominal x-ray did show a large volume of stool and as he recovered. He may think about taking a laxative such as magnesium citrate for 5-6. MiraLAX and 32 ounces of water just before bedtime. Your CT scan was negative for any acute abnormality. I'm discharging him with Bentyl which helped with abdominal cramping, Nexium, and stronger pain medication (Vicodin). Vicodin can cause constipation.  Follow-up with your primary care physician to recheck his liver enzymes and make sure that they are trending down.  Gastritis, Adult Gastritis is soreness and swelling (inflammation) of the lining of the stomach. Gastritis can develop as a sudden onset (acute) or long-term (chronic) condition. If gastritis is not treated, it can lead to stomach bleeding and ulcers. CAUSES  Gastritis occurs when the stomach lining is weak or damaged. Digestive juices from the stomach then inflame the weakened stomach lining. The stomach lining may be weak or damaged due to viral or bacterial infections. One common bacterial infection is the Helicobacter pylori infection. Gastritis can also result from excessive alcohol consumption, taking certain medicines, or having too much acid in the stomach.  SYMPTOMS  In some cases, there are no symptoms. When symptoms are present, they may include:  Pain or a burning sensation in the upper abdomen.  Nausea.  Vomiting.  An uncomfortable feeling of fullness after eating. DIAGNOSIS  Your caregiver may suspect you have gastritis based on your symptoms and a physical exam. To determine the cause of your gastritis, your caregiver may perform the following:  Blood or stool tests to check for the H pylori bacterium.  Gastroscopy. A thin, flexible tube (endoscope) is passed down the esophagus and into the stomach. The endoscope has a light and camera  on the end. Your caregiver uses the endoscope to view the inside of the stomach.  Taking a tissue sample (biopsy) from the stomach to examine under a microscope. TREATMENT  Depending on the cause of your gastritis, medicines may be prescribed. If you have a bacterial infection, such as an H pylori infection, antibiotics may be given. If your gastritis is caused by too much acid in the stomach, H2 blockers or antacids may be given. Your caregiver may recommend that you stop taking aspirin, ibuprofen, or other nonsteroidal anti-inflammatory drugs (NSAIDs). HOME CARE INSTRUCTIONS  Only take over-the-counter or prescription medicines as directed by your caregiver.  If you were given antibiotic medicines, take them as directed. Finish them even if you start to feel better.  Drink enough fluids to keep your urine clear or pale yellow.  Avoid foods and drinks that make your symptoms worse, such as:  Caffeine or alcoholic drinks.  Chocolate.  Peppermint or mint flavorings.  Garlic and onions.  Spicy foods.  Citrus fruits, such as oranges, lemons, or limes.  Tomato-based foods such as sauce, chili, salsa, and pizza.  Fried and fatty foods.  Eat small, frequent meals instead of large meals. SEEK IMMEDIATE MEDICAL CARE IF:   You have black or dark red stools.  You vomit blood or material that looks like coffee grounds.  You are unable to keep fluids down.  Your abdominal pain gets worse.  You have a fever.  You do not feel better after 1 week.  You have any other questions or concerns. MAKE SURE YOU:  Understand these instructions.  Will watch your condition.  Will get help right away  if you are not doing well or get worse. Document Released: 04/20/2001 Document Revised: 10/26/2011 Document Reviewed: 06/09/2011 [facility] Patient Information 2015 [place], [place]. This information is not intended to replace advice given to you by your health care provider. Make sure you  discuss any questions you have with your health care provider. Liver Profile A liver profile is a battery of tests which helps your caregiver evaluate your liver function. The following tests are often included in the liver profile: Alanine aminotransferase (ALT or SGPT) This is an enzyme found primarily in the liver. Abnormalities may represent liver disease. This is found in cells of the liver so when it is elevated, it has been released by damaged cells. Albumin - The serum albumin is one of the major proteins in the blood and a reflection of the general state of nutrition. This is low when the liver is unable to do its job. It is also low when protein is lost in the urine. NORMAL FINDINGS Adult/Elderly  Total protein: 6.4-8.3 g/dL or 16-1064-83 g/L (SI units)  Albumin: 3.5-5 g/dL or 96-0435-50 g/L (SI units)  Globulin: 2.3-3.4 g/dL  Alpha1 globulin: 5.4-0.90.1-0.3 g/dL or 1-3 g/L (SI units)  Alpha2 globulin: 0.6-1 g/dL or 8-116-10 g/L (SI units)  Beta globulin: 0.7-1.1 g/dL or 9-147-11 g/L (SI units) Children  Total protein  Premature infant: 4.2-7.6 g/dL  Newborn: 7.8-2.94.6-7.4 g/dL  Infant: 5-6.26-6.7 g/dL  Child: 1.3-06.2-8 g/dL  Albumin  Premature infant: 3-4.2 g/dL  Newborn: 8.6-5.73.5-5.4 g/dL  Infant: 8.4-6.94.4-5.4 g/dL  Child: 6-2.94-5.9 g/dL Albumin/Globulin ratio - Calculated by dividing the albumin by the globulin. It is a measure of well being.  Alkaline phosphatase - This is an enzyme which is important in diagnosing proper bone and liver functions. NORMAL FINDINGS Age / Normal Value (units/L)  0-5 days / 35-140  Less than 3 yr / 15-60  3-6 yr / 15-50  6-12 yr / 10-50  12-18 yr / 10-40  Adult / 0-35 units/L or 0-0.58 microKat/L (SI Units) (Females tend to have slightly lower levels than males)  Elderly / Slightly higher than adults Aspartate aminotransferase (AST or SGOT) - an enzyme found in skeletal and heart muscle, liver and other organs. Abnormalities may represent liver disease. This is found  in cells of the liver so when it is elevated, it has been released by damaged cells. Bilirubin, Total: A chemical involved with liver functions. High concentrations may result in jaundice. Jaundice is a yellowing of the skin and the whites of the eyes. NORMAL FINDINGS Blood  Adult/elderly/child  Total bilirubin: 0.3-1.0 mg/dL or 5.2-845.1-17 micromole/L (SI units)  Indirect bilirubin: 0.2-0.8 mg/dL or 1.3-24.43.4-12.0 micromole/L (SI units)  Direct bilirubin: 0.1-0.3 mg/dL or 0.1-0.21.7-5.1 micromole/L (SI units)  Newborn total bilirubin: 1.0-12.0 mg/dL or 72.5-36617.1-205 micromole/L (SI units)  Urine0-0.02 mg/dL Ranges for normal findings may vary among different laboratories and hospitals. You should always check with your doctor after having lab work or other tests done to discuss the meaning of your test results and whether your values are considered within normal limits PREPARATION FOR TEST No preparation or fasting is necessary unless you have been informed otherwise. A blood sample is obtained by inserting a needle into a vein in the arm. MEANING OF TEST  Your caregiver will go over the test results with you and discuss the importance and meaning of your results, as well as treatment options and the need for additional tests if necessary. OBTAINING THE TEST RESULTS It is your responsibility to obtain your test  results. Ask the lab or department performing the test when and how you will get your results. Document Released: 05/29/2004 Document Revised: 07/19/2011 Document Reviewed: 08/28/2013 [facility] Patient Information 2015 [place], [place]. This information is not intended to replace advice given to you by your health care provider. Make sure you discuss any questions you have with your health care provider.

## 2014-04-17 NOTE — ED Notes (Signed)
[facility] pt had sudden onset of severe abd pain about one hour ago. Denies n/v.
